# Patient Record
Sex: Female | Born: 1970 | Hispanic: Refuse to answer | Marital: Single
Health system: Midwestern US, Academic
[De-identification: ages and names within clinical notes are randomized; demographics above are authoritative.]

---

## 2019-09-25 ENCOUNTER — Encounter: Admit: 2019-09-25 | Discharge: 2019-09-25 | Payer: MEDICARE | Primary: Neurology

## 2019-09-25 NOTE — Telephone Encounter
Spoke with pt    Initial Visit Date: 09/27/19     Reason for Visit:epilepsy     Any recent injuries?    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 Then by who and at what age? 2003     Physician Information  PCP: will bring name to appt  Referring Physician: self referred  Previous Neurologist:  Dr. Allegra Grana  Other providers seen:      Previous Imaging/Procedures: at [facility], requesting records     Upcoming [facility] Appointments:     Recent ED/Hospitalizations:      Current Meds: pt cannot remember names and does not have with her, will bring bottles with her  Current Allergies: will bring list with her to visit  Past Tried and Failed AED: unsure  Reason for Discontinuing any AED's:

## 2019-09-27 ENCOUNTER — Encounter: Admit: 2019-09-27 | Discharge: 2019-09-27 | Payer: MEDICARE | Primary: Neurology

## 2019-09-27 ENCOUNTER — Ambulatory Visit: Admit: 2019-09-27 | Discharge: 2019-09-28 | Payer: MEDICARE | Primary: Neurology

## 2019-09-27 DIAGNOSIS — F4310 Post-traumatic stress disorder, unspecified: Secondary | ICD-10-CM

## 2019-09-27 DIAGNOSIS — R569 Unspecified convulsions: Secondary | ICD-10-CM

## 2019-09-27 DIAGNOSIS — I10 Essential (primary) hypertension: Secondary | ICD-10-CM

## 2019-09-27 DIAGNOSIS — G40209 Localization-related (focal) (partial) symptomatic epilepsy and epileptic syndromes with complex partial seizures, not intractable, without status epilepticus: Principal | ICD-10-CM

## 2019-09-27 MED ORDER — LAMOTRIGINE 300 MG PO TR24
300 mg | ORAL_TABLET | Freq: Every day | ORAL | 3 refills | Status: AC
Start: 2019-09-27 — End: ?

## 2019-09-27 MED ORDER — SUMATRIPTAN SUCCINATE 50 MG PO TAB
50 mg | ORAL_TABLET | ORAL | 1 refills | Status: AC | PRN
Start: 2019-09-27 — End: ?

## 2019-09-28 ENCOUNTER — Encounter: Admit: 2019-09-28 | Discharge: 2019-09-28 | Payer: MEDICARE | Primary: Neurology

## 2019-09-28 DIAGNOSIS — I10 Essential (primary) hypertension: Secondary | ICD-10-CM

## 2019-09-28 DIAGNOSIS — F4310 Post-traumatic stress disorder, unspecified: Secondary | ICD-10-CM

## 2019-09-28 DIAGNOSIS — R569 Unspecified convulsions: Secondary | ICD-10-CM

## 2020-01-28 ENCOUNTER — Ambulatory Visit: Admit: 2020-01-28 | Discharge: 2020-01-28 | Payer: MEDICARE | Primary: Neurology

## 2020-01-28 ENCOUNTER — Encounter: Admit: 2020-01-28 | Discharge: 2020-01-28 | Payer: MEDICARE | Primary: Neurology

## 2020-01-28 DIAGNOSIS — G40909 Epilepsy, unspecified, not intractable, without status epilepticus: Secondary | ICD-10-CM

## 2020-01-28 DIAGNOSIS — G43009 Migraine without aura, not intractable, without status migrainosus: Secondary | ICD-10-CM

## 2020-01-28 DIAGNOSIS — I10 Essential (primary) hypertension: Secondary | ICD-10-CM

## 2020-01-28 DIAGNOSIS — F4310 Post-traumatic stress disorder, unspecified: Secondary | ICD-10-CM

## 2020-01-28 DIAGNOSIS — R569 Unspecified convulsions: Secondary | ICD-10-CM

## 2020-01-28 MED ORDER — LAMOTRIGINE 200 MG PO TR24
200 mg | ORAL_TABLET | Freq: Every day | ORAL | 11 refills | Status: AC
Start: 2020-01-28 — End: ?

## 2020-01-28 MED ORDER — LAMOTRIGINE 50 MG PO TR24
ORAL_TABLET | Freq: Every day | 0 refills | Status: AC
Start: 2020-01-28 — End: ?

## 2020-01-28 MED ORDER — LACOSAMIDE 200 MG PO TAB
200 mg | ORAL_TABLET | Freq: Two times a day (BID) | ORAL | 1 refills | 52.50000 days | Status: AC
Start: 2020-01-28 — End: ?

## 2020-01-28 NOTE — Procedures
OUTPATIENT ROUTINE EEG REPORT    Catherine Taylor  [birth year]  [id number]    Date of service: 01/28/20    History: This is a 49 y.o. female presenting with epileptic and non-epileptic seizures.     Pertinent medications: lacosamide, lamotrigine    Introduction:  This study was performed using digital electroencephalographic recording equipment. International 10-20 electrode placement was used along with FT9 and FT10 electrodes. The record was obtained with the patient in the awake and drowsy states.  Photic stimulation is performed.  Hyperventilation is not performed.  The study is performed from 13:37 to 14:03 on 01/28/20.    Description:   The posterior dominant rhythm is 10 Hz.     Drowsiness results in attenuation of the background and increased theta activity. Stage II of sleep is not seen.    Clinical correlation:  This is a normal awake and drowsy routine EEG.  No epileptiform findings or lateralizing signs are seen.    Barb Merino, MD  Epilepsy Fellow

## 2020-01-28 NOTE — Progress Notes
Date of Service: 01/28/2020    Subjective:             Catherine Taylor is a 49 y.o. female.    History of Present Illness  She may have had a headache during sleep. She woke up with a bad migraine. This is usually how she knows she had a seizure. No tongue bite or urinary incontinence.   She did not notice any difference in the way she feels with the increase in Lamictal. Prior to increase in Lamictal she was having 1-2 seizure per month.     Migraine 2-3 times a month. Imitrex for acute treatment works well.          Review of Systems      Objective:         ? calcium carbonate/vitamin D3 (CALCIUM + D PO) Take  by mouth.   ? FOLIC ACID PO Take  by mouth.   ? hydroCHLOROthiazide (HYDRODIURIL) 50 mg tablet Take 25 mg by mouth every morning.   ? lacosamide (VIMPAT) 200 mg tab Take  by mouth twice daily.   ? lamoTRIgine XR (LAMICTAL XR) 300 mg tablet Take one tablet by mouth daily.   ? magnesium citrate oral solution Take 296 mL by mouth once.   ? prazosin (MINIPRESS) 5 mg capsule Take 5 mg by mouth at bedtime daily.   ? sertraline (ZOLOFT) 50 mg tablet Take 50 mg by mouth daily.   ? SUMAtriptan succinate (IMITREX) 50 mg tablet Take one tablet by mouth every 2 hours as needed for Migraine symptoms (NTE 2 doses in 24 hours). Dose may be repeated in 2 hours if needed. Max of 2 tablets in 24 hours.   ? tiZANidine (ZANAFLEX) 4 mg tablet Take 4 mg by mouth every 6 hours as needed.   ? traZODone (DESYREL) 100 mg tablet Take 100 mg by mouth at bedtime daily.     Vitals:    01/28/20 1418   BP: (!) 154/86   Pulse: 61   Weight: 74.4 kg (164 lb)   Height: 165.1 cm (65)   PainSc: Zero     Body mass index is 27.29 kg/m?Marland Kitchen     Physical Exam    Mental Status:Grossly normal  Speech is fluent without dysarthria.    CN:   III, IV, VI: EOEMI without nystagmus  VII: facial movements are symmetric      Motor:  No pronator drift.     Coordination:    RUE LUE RLE LLE   Finger to Nose normal normal       Gait  Casual: normal Assessment and Plan:          Unclassified epileptic seizures (HCC)  One breakthrough seizure since last visit.   Will increase lamotrigine XR to 350 mg daily for 2 weeks then 400 mg daily.   Continue vimpat 200 mg BID.     Migraine without aura and without status migrainosus, not intractable  Doing well on Imitrex 50 mg for acute treatment.

## 2020-01-28 NOTE — Assessment & Plan Note
Doing well on Imitrex 50 mg for acute treatment.

## 2020-01-28 NOTE — Patient Instructions
Increase lamictal xr as follows    Week 1-2 lamictal XR 350 mg a day then increase to 400 mg a day.

## 2020-01-28 NOTE — Progress Notes
Routine 25 minute outpatient EEG completed without complications. Recording began at 13:35:55.    Paperwork including the patient history and EEG information sheet was provided to the patient.     The test was explained to the patient in detail and all questions were answered regarding the performing of the EEG.     All electrodes were below 5,000kOhm and recording properly. Photic Stimulation was performed. Hyperventilation was not performed due to current ACNS guidelines.    After the test the scalp was inspected for any skin breakdown and was cleaned with warm water and  a washcloth. Education was given to the patient about proper hair care after the EEG.    The patient was educated to check in MyChart or contact the physician's office in approximately 2 weeks regarding the results of their EEG.

## 2020-01-28 NOTE — Assessment & Plan Note
One breakthrough seizure since last visit.   Will increase lamotrigine XR to 350 mg daily for 2 weeks then 400 mg daily.   Continue vimpat 200 mg BID.

## 2020-05-14 ENCOUNTER — Encounter: Admit: 2020-05-14 | Discharge: 2020-05-14 | Payer: MEDICARE | Primary: Neurology

## 2020-05-14 ENCOUNTER — Ambulatory Visit: Admit: 2020-05-14 | Discharge: 2020-05-15 | Payer: MEDICARE | Primary: Neurology

## 2020-05-14 DIAGNOSIS — R569 Unspecified convulsions: Secondary | ICD-10-CM

## 2020-05-14 DIAGNOSIS — F4310 Post-traumatic stress disorder, unspecified: Secondary | ICD-10-CM

## 2020-05-14 DIAGNOSIS — I10 Essential (primary) hypertension: Secondary | ICD-10-CM

## 2020-09-03 MED ORDER — SUMATRIPTAN SUCCINATE 50 MG PO TAB
50 mg | ORAL_TABLET | ORAL | 1 refills | Status: CN | PRN
Start: 2020-09-03 — End: ?

## 2020-09-04 ENCOUNTER — Encounter: Admit: 2020-09-04 | Discharge: 2020-09-04 | Payer: MEDICARE | Primary: Neurology

## 2020-09-04 ENCOUNTER — Ambulatory Visit: Admit: 2020-09-04 | Discharge: 2020-09-05 | Payer: MEDICARE | Primary: Neurology

## 2020-09-04 DIAGNOSIS — I10 Essential (primary) hypertension: Secondary | ICD-10-CM

## 2020-09-04 DIAGNOSIS — G40909 Epilepsy, unspecified, not intractable, without status epilepticus: Secondary | ICD-10-CM

## 2020-09-04 DIAGNOSIS — G43009 Migraine without aura, not intractable, without status migrainosus: Secondary | ICD-10-CM

## 2020-09-04 DIAGNOSIS — R569 Unspecified convulsions: Secondary | ICD-10-CM

## 2020-09-04 DIAGNOSIS — F4310 Post-traumatic stress disorder, unspecified: Secondary | ICD-10-CM

## 2020-09-04 MED ORDER — LAMOTRIGINE 300 MG PO TR24
300 mg | ORAL_TABLET | Freq: Every day | ORAL | 3 refills | Status: AC
Start: 2020-09-04 — End: ?

## 2020-09-04 MED ORDER — LACOSAMIDE 200 MG PO TAB
200 mg | ORAL_TABLET | Freq: Two times a day (BID) | ORAL | 1 refills | 52.50000 days | Status: AC
Start: 2020-09-04 — End: ?

## 2020-09-04 NOTE — Progress Notes
Date of Service: 09/04/2020    Subjective:             Catherine Taylor is a 50 y.o. female.    History of Present Illness  She was under a lot of stress last month. She had 2 seizures. She was stressed due to her mother's illness. One was epileptic and one was not. Last epileptic seizure was 18 mo ago.     One happened right before she was going to take her medications in the morning.   She felt the seizure coming. She showed me a video today. She had a blank stare. She is unaware. She has repetitive swallowing and lip smacking.     With her more recent seizure she did not lose consciousness. She felt her body shaking. This felt similar to her non-epileptic seizures in the past.     Migraines occur post ictal. Will take  Ibuprofen 800 mg which works well.          Review of Systems      Objective:         ? calcium carbonate/vitamin D3 (CALCIUM + D PO) Take  by mouth.   ? FOLIC ACID PO Take  by mouth.   ? hydroCHLOROthiazide (HYDRODIURIL) 50 mg tablet Take 25 mg by mouth every morning.   ? lacosamide (VIMPAT) 200 mg tablet Take one tablet by mouth twice daily.   ? lamoTRIgine XR (LAMICTAL XR) 300 mg tablet Take one tablet by mouth daily.   ? lamoTRIgine XR (LAMICTAL XR) 50 mg tablet 50 mg PO daily x14d (take with 300 mg tab)   ? magnesium citrate oral solution Take 296 mL by mouth once.   ? prazosin (MINIPRESS) 5 mg capsule Take 5 mg by mouth at bedtime daily.   ? sertraline (ZOLOFT) 50 mg tablet Take 50 mg by mouth daily.   ? SUMAtriptan succinate (IMITREX) 50 mg tablet Take one tablet by mouth every 2 hours as needed for Migraine symptoms (NTE 2 doses in 24 hours). Dose may be repeated in 2 hours if needed. Max of 2 tablets in 24 hours.   ? tiZANidine (ZANAFLEX) 4 mg tablet Take 4 mg by mouth every 6 hours as needed.   ? traZODone (DESYREL) 100 mg tablet Take 100 mg by mouth at bedtime daily.     Vitals:    09/04/20 1436   BP: (!) 174/98   Pulse: 88   Weight: 74.8 kg (165 lb)   Height: 165.1 cm (5' 5)   PainSc: Zero     Body mass index is 27.46 kg/m?Marland Kitchen     Physical Exam  Mental Status:Grossly normal  Speech is fluent without dysarthria.    CN:   III, IV, VI: EOEMI without nystagmus  VII: facial movements are symmetric      Motor:  No pronator drift.       Gait  Casual: normal             Assessment and Plan:          Unclassified epileptic seizures [facility])  Patient has both epileptic and non epileptic seizures. She has had at least one epileptic and one non epileptic seizure in the last month.   Will continue Vimpat 200 mg BID and Lamotrigine XR 300 mg daily.   follow up in 4 mo.

## 2020-09-04 NOTE — Assessment & Plan Note
Patient has both epileptic and non epileptic seizures. She has had at least one epileptic and one non epileptic seizure in the last month.   Will continue Vimpat 200 mg BID and Lamotrigine XR 300 mg daily.   follow up in 4 mo.

## 2020-09-05 DIAGNOSIS — G40209 Localization-related (focal) (partial) symptomatic epilepsy and epileptic syndromes with complex partial seizures, not intractable, without status epilepticus: Secondary | ICD-10-CM

## 2021-01-14 ENCOUNTER — Ambulatory Visit: Admit: 2021-01-14 | Discharge: 2021-01-15 | Payer: MEDICARE | Primary: Neurology

## 2021-01-14 ENCOUNTER — Encounter: Admit: 2021-01-14 | Discharge: 2021-01-14 | Payer: MEDICARE | Primary: Neurology

## 2021-01-14 DIAGNOSIS — R569 Unspecified convulsions: Secondary | ICD-10-CM

## 2021-01-14 DIAGNOSIS — G43909 Migraine, unspecified, not intractable, without status migrainosus: Secondary | ICD-10-CM

## 2021-01-14 DIAGNOSIS — M1990 Unspecified osteoarthritis, unspecified site: Secondary | ICD-10-CM

## 2021-01-14 DIAGNOSIS — I10 Essential (primary) hypertension: Secondary | ICD-10-CM

## 2021-01-14 DIAGNOSIS — R519 Generalized headaches: Secondary | ICD-10-CM

## 2021-01-14 DIAGNOSIS — G43009 Migraine without aura, not intractable, without status migrainosus: Secondary | ICD-10-CM

## 2021-01-14 DIAGNOSIS — F4310 Post-traumatic stress disorder, unspecified: Secondary | ICD-10-CM

## 2021-01-14 DIAGNOSIS — G479 Sleep disorder, unspecified: Secondary | ICD-10-CM

## 2021-01-14 MED ORDER — MAGNESIUM OXIDE 400 MG (241.3 MG MAGNESIUM) PO TAB
400 mg | ORAL_TABLET | Freq: Every day | ORAL | 6 refills | Status: AC
Start: 2021-01-14 — End: ?

## 2021-01-14 MED ORDER — LACOSAMIDE 200 MG PO TAB
200 mg | ORAL_TABLET | Freq: Two times a day (BID) | ORAL | 4 refills | 52.50000 days | Status: AC
Start: 2021-01-14 — End: ?

## 2021-01-14 MED ORDER — LAMOTRIGINE 300 MG PO TR24
300 mg | ORAL_TABLET | Freq: Every day | ORAL | 3 refills | Status: AC
Start: 2021-01-14 — End: ?
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
## 2021-01-15 DIAGNOSIS — G40209 Localization-related (focal) (partial) symptomatic epilepsy and epileptic syndromes with complex partial seizures, not intractable, without status epilepticus: Secondary | ICD-10-CM

## 2021-02-20 ENCOUNTER — Encounter: Admit: 2021-02-20 | Discharge: 2021-02-20 | Payer: MEDICARE | Primary: Neurology

## 2021-02-20 MED ORDER — LAMOTRIGINE 200 MG PO TR24
ORAL_TABLET | Freq: Every day | 11 refills
Start: 2021-02-20 — End: ?

## 2021-02-20 NOTE — Telephone Encounter
Called pt to verify if she is still using [facility] mail [facility] due to a refill request we received from [place] in [place]. Pt stated she does not use this [facility] in [place] and never has. Will refuse refill and delete that pharmacy.

## 2021-09-21 ENCOUNTER — Ambulatory Visit: Admit: 2021-09-21 | Discharge: 2021-09-22 | Payer: MEDICARE | Primary: Neurology

## 2021-09-21 ENCOUNTER — Encounter: Admit: 2021-09-21 | Discharge: 2021-09-21 | Payer: MEDICARE | Primary: Neurology

## 2021-09-21 DIAGNOSIS — M1990 Unspecified osteoarthritis, unspecified site: Secondary | ICD-10-CM

## 2021-09-21 DIAGNOSIS — G40909 Epilepsy, unspecified, not intractable, without status epilepticus: Secondary | ICD-10-CM

## 2021-09-21 DIAGNOSIS — I10 Essential (primary) hypertension: Secondary | ICD-10-CM

## 2021-09-21 DIAGNOSIS — R519 Generalized headaches: Secondary | ICD-10-CM

## 2021-09-21 DIAGNOSIS — G479 Sleep disorder, unspecified: Secondary | ICD-10-CM

## 2021-09-21 DIAGNOSIS — G43009 Migraine without aura, not intractable, without status migrainosus: Secondary | ICD-10-CM

## 2021-09-21 DIAGNOSIS — G43909 Migraine, unspecified, not intractable, without status migrainosus: Secondary | ICD-10-CM

## 2021-09-21 DIAGNOSIS — R569 Unspecified convulsions: Secondary | ICD-10-CM

## 2021-09-21 DIAGNOSIS — F4310 Post-traumatic stress disorder, unspecified: Secondary | ICD-10-CM

## 2021-09-21 MED ORDER — LORAZEPAM 1 MG PO TAB
ORAL_TABLET | ORAL | 0 refills | 12.00000 days | Status: AC
Start: 2021-09-21 — End: ?

## 2021-09-21 MED ORDER — LAMOTRIGINE 300 MG PO TR24
300 mg | ORAL_TABLET | Freq: Every day | ORAL | 3 refills | Status: AC
Start: 2021-09-21 — End: ?

## 2021-09-21 NOTE — Assessment & Plan Note
improved- continue magnesium for prevention and ibuprofen for acute treatment.

## 2021-09-21 NOTE — Progress Notes
CC:    HPI:  Saw Dr. Trish Mage for headaches. Was started on magnesium.     She feels like lacosamide makes her mouth numb. This just started happening recently. She notices about 5 minutes after she takes it her mouth will get numb. No swelling. This will last for 3-4 minutes and then goes back to normal. No slurred speech.     She had a recent seizures. Last month she had 3 seizures. She feels like this was due to stress. Her father had a recent stroke.   She was told she had a seizure during sleep. She woke up with a migraine.     Overall migraines have improved. She has about 3 migraines per month. She takes ibuprofen 800 mg with completely relieves her migraine.      Per prior notes:  Aura: will get a funny feeling like something is going to happen. Has both epileptic and non epileptic seizures. Will have loss of consciousness (these are the epileptic ones).  Ictal: eyes open, left hand shaking, lip jittery, makes noises.duration 3 minutes.    Post Ictal: confused, takes about 5 minutes to recover.   Current AEDs: vimpat 200 mg bid and lamictal 250 mg daily  Past AEDs: keppra  EEG: 01/28/2020: normal  05/14/2013: left fronto-temporal sharp waves [facility]ing)  04/17/2010: left fronto-temporal slowing.     MRI: at [facility] (2017)- showed white matter disease.   ?  nonepileptic- no loss of awareness. Feels paralyzed. Feel like she is outside of her body and looking down at herself. Last about 3 min. Has not had one of these for 4-5 months.   ?  Triggers- stress,            Medical History:   Diagnosis Date   ? Arthritis    ? Convulsion (HCC)    ? Generalized headaches    ? Hypertension    ? Migraines    ? PTSD (post-traumatic stress disorder)    ? Sleep disorder      Surgical History:   Procedure Laterality Date   ? THYROID SURGERY       Family History   Problem Relation Age of Onset   ? Stroke Mother    ? Unknown to Patient Father    ? Seizures Neg Hx      Social History     Socioeconomic History ? Marital status: Single   Tobacco Use   ? Smoking status: Never   ? Smokeless tobacco: Never   Substance and Sexual Activity   ? Alcohol use: Yes     Alcohol/week: 0.0 standard drinks   ? Drug use: Never   ? Sexual activity: Yes     Partners: Male     Birth control/protection: Surgical       No Known Allergies        Current Outpatient Medications:   ?  calcium carbonate/vitamin D3 (CALCIUM + D PO), Take  by mouth., Disp: , Rfl:   ?  FOLIC ACID PO, Take  by mouth., Disp: , Rfl:   ?  hydroCHLOROthiazide (HYDRODIURIL) 50 mg tablet, Take one-half tablet by mouth every morning., Disp: , Rfl:   ?  lacosamide (VIMPAT) 200 mg tablet, Take one tablet by mouth twice daily., Disp: 180 tablet, Rfl: 4  ?  lamoTRIgine XR (LAMICTAL XR) 300 mg tablet, Take one tablet by mouth daily., Disp: 90 tablet, Rfl: 3  ?  magnesium citrate oral solution, Take 296 mL by mouth once., Disp: , Rfl:   ?  magnesium oxide (MAGOX) 400 mg (241.3 mg magnesium) tablet, Take one tablet by mouth daily. Indications: For migraine headaches, Disp: 30 tablet, Rfl: 6  ?  prazosin (MINIPRESS) 5 mg capsule, Take one capsule by mouth at bedtime daily., Disp: , Rfl:   ?  sertraline (ZOLOFT) 50 mg tablet, Take one tablet by mouth daily., Disp: , Rfl:   ?  tiZANidine (ZANAFLEX) 4 mg tablet, Take one tablet by mouth every 6 hours as needed., Disp: , Rfl:   ?  traZODone (DESYREL) 100 mg tablet, Take one tablet by mouth at bedtime daily., Disp: , Rfl:       PE:  Vitals:    09/21/21 1059   BP: (!) 162/102   Pulse: 80   Temp: 36.6 ?C (97.8 ?F)   Resp: 18   SpO2: 98%         Mental Status:Grossly normal  Speech is fluent without dysarthria.    CN:   III, IV, VI: EOEMI without nystagmus  VII: facial movements are symmetric      Motor:  No pronator drift.     Coordination:    RUE LUE RLE LLE   Finger to Nose normal normal       Gait  Casual: normal             IMPRESSION/PLAN      Unclassified epileptic seizures (HCC)  Patient continues to have seizures   Will change to BRAND lacosamide due to mouth numbness while taking generic lacosamide.   Continue lamotrigine xr 300 mg   MRI brian wo- 3 t seizure protocol.   We discussed epilepsy presurgical evaluation. At this point patient does not want to be scheduled for video-EEG but will get MRI.     Migraine without aura and without status migrainosus, not intractable  improved- continue magnesium for prevention and ibuprofen for acute treatment.      Total Time Today was 35 minutes in the following activities: Preparing to see the patient, Performing a medically appropriate examination and/or evaluation, Ordering medications, tests, or procedures and Documenting clinical information in the electronic or other health record

## 2021-09-22 ENCOUNTER — Encounter: Admit: 2021-09-22 | Discharge: 2021-09-22 | Payer: MEDICARE | Primary: Neurology

## 2021-09-22 DIAGNOSIS — G40209 Localization-related (focal) (partial) symptomatic epilepsy and epileptic syndromes with complex partial seizures, not intractable, without status epilepticus: Secondary | ICD-10-CM

## 2021-09-22 MED ORDER — VIMPAT 200 MG PO TAB
200 mg | ORAL_TABLET | Freq: Two times a day (BID) | ORAL | 5 refills | 52.50000 days | Status: AC
Start: 2021-09-22 — End: ?

## 2021-09-23 ENCOUNTER — Encounter: Admit: 2021-09-23 | Discharge: 2021-09-23 | Payer: MEDICARE | Primary: Neurology

## 2021-12-25 ENCOUNTER — Encounter: Admit: 2021-12-25 | Discharge: 2021-12-25 | Payer: MEDICARE | Primary: Neurology

## 2021-12-25 ENCOUNTER — Ambulatory Visit: Admit: 2021-12-25 | Discharge: 2021-12-25 | Payer: MEDICARE | Primary: Neurology

## 2021-12-25 DIAGNOSIS — G40209 Localization-related (focal) (partial) symptomatic epilepsy and epileptic syndromes with complex partial seizures, not intractable, without status epilepticus: Secondary | ICD-10-CM

## 2022-01-11 ENCOUNTER — Encounter: Admit: 2022-01-11 | Discharge: 2022-01-11 | Payer: MEDICARE | Primary: Neurology

## 2022-01-11 DIAGNOSIS — G40209 Localization-related (focal) (partial) symptomatic epilepsy and epileptic syndromes with complex partial seizures, not intractable, without status epilepticus: Secondary | ICD-10-CM

## 2022-01-11 MED ORDER — LAMOTRIGINE 300 MG PO TR24
300 mg | ORAL_TABLET | Freq: Every day | ORAL | 3 refills | Status: AC
Start: 2022-01-11 — End: ?

## 2022-01-21 ENCOUNTER — Ambulatory Visit: Admit: 2022-01-21 | Discharge: 2022-01-22 | Payer: MEDICARE | Primary: Neurology

## 2022-01-21 ENCOUNTER — Encounter: Admit: 2022-01-21 | Discharge: 2022-01-21 | Payer: MEDICARE | Primary: Neurology

## 2022-01-21 DIAGNOSIS — G40909 Epilepsy, unspecified, not intractable, without status epilepticus: Secondary | ICD-10-CM

## 2022-01-21 DIAGNOSIS — G43009 Migraine without aura, not intractable, without status migrainosus: Secondary | ICD-10-CM

## 2022-01-21 DIAGNOSIS — F4310 Post-traumatic stress disorder, unspecified: Secondary | ICD-10-CM

## 2022-01-21 DIAGNOSIS — M1990 Unspecified osteoarthritis, unspecified site: Secondary | ICD-10-CM

## 2022-01-21 DIAGNOSIS — G479 Sleep disorder, unspecified: Secondary | ICD-10-CM

## 2022-01-21 DIAGNOSIS — G40209 Localization-related (focal) (partial) symptomatic epilepsy and epileptic syndromes with complex partial seizures, not intractable, without status epilepticus: Secondary | ICD-10-CM

## 2022-01-21 DIAGNOSIS — G43909 Migraine, unspecified, not intractable, without status migrainosus: Secondary | ICD-10-CM

## 2022-01-21 DIAGNOSIS — R519 Generalized headaches: Secondary | ICD-10-CM

## 2022-01-21 DIAGNOSIS — R569 Unspecified convulsions: Secondary | ICD-10-CM

## 2022-01-21 DIAGNOSIS — I10 Essential (primary) hypertension: Secondary | ICD-10-CM

## 2022-01-21 MED ORDER — VIMPAT 200 MG PO TAB
200 mg | ORAL_TABLET | Freq: Two times a day (BID) | ORAL | 1 refills | 52.50000 days | Status: AC
Start: 2022-01-21 — End: ?

## 2022-01-21 NOTE — Assessment & Plan Note
Patient predominantly has migraines after seizures.  As her seizures have been well controlled over the last several months her migraines have improved as well.  She usually takes acute treatment with ibuprofen as needed.

## 2022-01-21 NOTE — Progress Notes
CC:    HPI:  Changed back to BRAND Vimpat. Mouth is still numb. She has this sensation after she takes her medication. Will stay numb for 3-4 minutes.     April 2023 was her last seizure. She was asleep when this seizure started. Woke up someone in her house by making noises, she was having convulsions. Postictal was not able to breathe. She did bite her tongue, was very weak afterwards. She had missed 2 doses of medications. Saw her PCP a few days and her PCP noticed her face was droop, slurred speech. Took her about a week to feel back to normal.     Headaches have been much better. She usually only has headaches after seizures.   Per prior notes:  Aura:?will get a funny feeling like something is going to happen. Has both epileptic and non epileptic seizures. Will have?loss of consciousness?(these are the epileptic ones).  Ictal:?eyes open, left hand shaking, lip jittery, makes noises.duration 3 minutes.??  Post Ictal:?confused, takes about 5 minutes to recover.?  Current AEDs:?vimpat 200 mg bid and lamictal 250 mg daily  Past AEDs:?keppra  EEG:?01/28/2020: normal  05/14/2013: left fronto-temporal sharp waves [facility])  04/17/2010: left fronto-temporal slowing.   ?  MRI:?at [facility] (2017)- showed white matter disease.   MRI 12/25/21: Normal.   ?  nonepileptic- no loss of awareness. Feels paralyzed. Feel like she is outside of her body and looking down at herself. Last about 3 min. Has not had one of these for 4-5 months.   ?  Triggers- stress,     Compliant with meds: Yes   Driving: Yes           Medical History:   Diagnosis Date   ? Arthritis    ? Convulsion (HCC)    ? Generalized headaches    ? Hypertension    ? Migraines    ? PTSD (post-traumatic stress disorder)    ? Sleep disorder      Surgical History:   Procedure Laterality Date   ? THYROID SURGERY       Family History   Problem Relation Age of Onset   ? Stroke Mother    ? Unknown to Patient Father    ? Seizures Neg Hx      Social History Socioeconomic History   ? Marital status: Single   Tobacco Use   ? Smoking status: Never   ? Smokeless tobacco: Never   Substance and Sexual Activity   ? Alcohol use: Yes     Alcohol/week: 0.0 standard drinks of alcohol   ? Drug use: Never   ? Sexual activity: Yes     Partners: Male     Birth control/protection: Surgical       No Known Allergies        Current Outpatient Medications:   ?  amLODIPine (NORVASC) 5 mg tablet, Take one tablet by mouth daily., Disp: , Rfl:   ?  atorvastatin (LIPITOR) 20 mg tablet, Take one tablet by mouth daily., Disp: , Rfl:   ?  azelastine (ASTELIN) 137 mcg (0.1 %) nasal spray, Apply one spray to two sprays to each nostril as directed twice daily as needed., Disp: , Rfl:   ?  calcium carbonate/vitamin D3 (CALCIUM + D PO), Take  by mouth., Disp: , Rfl:   ?  diclofenac sodium (VOLTAREN) 1 % topical gel, Apply two g topically to affected area four times daily as needed., Disp: , Rfl:   ?  estradioL (ESTRACE) 0.01 % (  0.1 mg/g) vaginal cream, Insert or Apply one g to vaginal area every 7 days., Disp: , Rfl:   ?  lamoTRIgine XR (LAMICTAL XR) 300 mg tablet, Take one tablet by mouth daily., Disp: 90 tablet, Rfl: 3  ?  LORazepam (ATIVAN) 1 mg tablet, 1 PO one hour prior to MRI, may repeat once., Disp: 2 tablet, Rfl: 0  ?  magnesium citrate oral solution, Take 296 mL by mouth once., Disp: , Rfl:   ?  magnesium oxide (MAGOX) 400 mg (241.3 mg magnesium) tablet, Take one tablet by mouth daily. Indications: For migraine headaches, Disp: 30 tablet, Rfl: 6  ?  prazosin (MINIPRESS) 5 mg capsule, Take one capsule by mouth at bedtime daily., Disp: , Rfl:   ?  sertraline (ZOLOFT) 50 mg tablet, Take 1.5 tablets by mouth daily., Disp: , Rfl:   ?  traZODone (DESYREL) 100 mg tablet, Take one tablet by mouth at bedtime daily., Disp: , Rfl:   ?  venlafaxine XR (EFFEXOR XR) 37.5 mg capsule, Take one capsule by mouth daily., Disp: , Rfl:   ?  VIMPAT 200 mg tablet, Take one tablet by mouth twice daily., Disp: 60 tablet, Rfl: 5      PE:    Vitals:    01/21/22 1515   BP: (!) 155/100   Pulse: 72   Temp: 36.8 ?C (98.3 ?F)   SpO2: 100%   PainSc: Zero   Weight: 87.9 kg (193 lb 12.8 oz)   Height: 165.1 cm (5' 5)        Mental Status:Grossly normal  Speech is fluent without dysarthria.    CN:   III, IV, VI: EOEMI without nystagmus  VII: facial movements are symmetric      Motor:  No pronator drift.     Coordination:    RUE LUE RLE LLE   Finger to Nose normal normal       Gait  Casual: normal             IMPRESSION/PLAN      Unclassified epileptic seizures (HCC)  Overall her seizures are stable.  She did have 2 seizures at toward the end of April in the setting of missed medication.  She reports that after that seizure she had a facial droop and slurred speech for about a week.  This was noted by her primary care physician but has since resolved without treatment.  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    Plan she is doing well on brand name Vimpat 200 mg twice daily we will continue.    Migraine without aura and without status migrainosus, not intractable  Patient predominantly has migraines after seizures.  As her seizures have been well controlled over the last several months her migraines have improved as well.  She usually takes acute treatment with ibuprofen as needed.

## 2022-01-21 NOTE — Assessment & Plan Note
Overall her seizures are stable.  She did have 2 seizures at toward the end of April in the setting of missed medication.  She reports that after that seizure she had a facial droop and slurred speech for about a week.  This was noted by her primary care physician but has since resolved without treatment.  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    Plan she is doing well on brand name Vimpat 200 mg twice daily we will continue.

## 2022-01-22 ENCOUNTER — Encounter: Admit: 2022-01-22 | Discharge: 2022-01-22 | Payer: MEDICARE | Primary: Neurology

## 2022-01-22 NOTE — Telephone Encounter
Attempted to reach pt to ask her re: where to send the completed physician statement form for the disabled placard; mailbox was full so unable to leave a message.

## 2022-07-15 ENCOUNTER — Encounter: Admit: 2022-07-15 | Discharge: 2022-07-15 | Payer: MEDICARE | Primary: Neurology

## 2022-07-15 NOTE — Telephone Encounter
Called patient for updated insurance information to complete prior authorization.    PA for Lamotrigine XR submitted via CMM  Qty 90 for 90 days     Key: [id number]  PA Case ID: [id number]  Rx #: [id number]     PCN: [facility]  Member ID: [id number]  Jackquline Denmark

## 2023-03-23 ENCOUNTER — Encounter: Admit: 2023-03-23 | Discharge: 2023-03-23 | Payer: MEDICARE | Primary: Neurology

## 2023-05-02 ENCOUNTER — Ambulatory Visit: Admit: 2023-05-02 | Discharge: 2023-05-02 | Payer: MEDICARE | Primary: Neurology

## 2023-05-02 ENCOUNTER — Encounter: Admit: 2023-05-02 | Discharge: 2023-05-02 | Payer: MEDICARE | Primary: Neurology

## 2023-05-02 DIAGNOSIS — G40209 Localization-related (focal) (partial) symptomatic epilepsy and epileptic syndromes with complex partial seizures, not intractable, without status epilepticus: Secondary | ICD-10-CM

## 2023-05-02 DIAGNOSIS — G40909 Epilepsy, unspecified, not intractable, without status epilepticus: Secondary | ICD-10-CM

## 2023-05-02 DIAGNOSIS — G43009 Migraine without aura, not intractable, without status migrainosus: Secondary | ICD-10-CM

## 2023-05-02 MED ORDER — VIMPAT 200 MG PO TAB
200 mg | ORAL_TABLET | Freq: Two times a day (BID) | ORAL | 1 refills | 52.50000 days | Status: AC
Start: 2023-05-02 — End: ?

## 2023-05-02 MED ORDER — LAMOTRIGINE 300 MG PO TR24
300 mg | ORAL_TABLET | Freq: Every day | ORAL | 3 refills | Status: AC
Start: 2023-05-02 — End: ?
  Filled 2023-05-03: qty 90, 90d supply, fill #1

## 2023-05-02 MED ORDER — IBUPROFEN 800 MG PO TAB
800 mg | ORAL_TABLET | ORAL | 1 refills | Status: AC | PRN
Start: 2023-05-02 — End: ?
  Filled 2023-05-03: qty 30, 10d supply, fill #1

## 2023-05-02 NOTE — Progress Notes
CC: follow up seizures and headaches.     HPI:  October 2024: She was asleep. She had a seizure. She was having trouble breathing afterwards. Her body felt funny and weak. She saw he PCP the next day- her face was drooping. She was having trouble talking. This lasted about 24 hours until she was back to normal. She was very stressed out at this time.   She has lost her mom and her sister in the last year.     Has noticed when she coughs she will have pain in the left side of the head- over the ear and into the left temple. This will last days. Mild nausea, +photophobia. Will take ibuprofen which helped. This happens about every other month.       Per prior notes:  Aura: will get a funny feeling like something is going to happen. Has both epileptic and non epileptic seizures. Will have loss of consciousness (these are the epileptic ones).  Ictal: eyes open, left hand shaking, lip jittery, makes noises.duration 3 minutes.    Post Ictal: confused, takes about 5 minutes to recover.   Current AEDs: vimpat 200 mg bid and lamictal 300 mg daily  Past AEDs: keppra  EEG: 01/28/2020: normal  05/14/2013: left fronto-temporal sharp waves [facility])  04/17/2010: left fronto-temporal slowing.      MRI: at [facility] (2017)- showed white matter disease.   MRI 12/25/21: Normal.      nonepileptic- no loss of awareness. Feels paralyzed. Feel like she is outside of her body and looking down at herself. Last about 3 min.     Triggers- stress,       Compliant with meds: Yes   Driving: Yes           Past Medical History:   Diagnosis Date    Arthritis     Convulsion (HCC)     Generalized headaches     Hypertension     Migraines     PTSD (post-traumatic stress disorder)     Sleep disorder      Surgical History:   Procedure Laterality Date    THYROID SURGERY       Family History   Problem Relation Name Age of Onset    Stroke Mother      Unknown to Patient Father      Seizures Neg Hx       Social History     Socioeconomic History Marital status: Single   Tobacco Use    Smoking status: Never    Smokeless tobacco: Never   Substance and Sexual Activity    Alcohol use: Yes     Alcohol/week: 0.0 standard drinks of alcohol    Drug use: Never    Sexual activity: Yes     Partners: Male     Birth control/protection: Surgical       No Known Allergies        Current Outpatient Medications:     amLODIPine (NORVASC) 5 mg tablet, Take one tablet by mouth daily., Disp: , Rfl:     atorvastatin (LIPITOR) 20 mg tablet, Take one tablet by mouth daily., Disp: , Rfl:     azelastine (ASTELIN) 137 mcg (0.1 %) nasal spray, Apply one spray to two sprays to each nostril as directed twice daily as needed., Disp: , Rfl:     calcium carbonate/vitamin D3 (CALCIUM + D PO), Take  by mouth., Disp: , Rfl:     diclofenac sodium (VOLTAREN) 1 % topical gel, Apply two  g topically to affected area four times daily as needed., Disp: , Rfl:     estradioL (ESTRACE) 0.01 % (0.1 mg/g) vaginal cream, Insert or Apply one g to vaginal area every 7 days., Disp: , Rfl:     lamoTRIgine XR (LAMICTAL XR) 300 mg tablet, Take one tablet by mouth daily., Disp: 90 tablet, Rfl: 3    LORazepam (ATIVAN) 1 mg tablet, 1 PO one hour prior to MRI, may repeat once., Disp: 2 tablet, Rfl: 0    magnesium citrate oral solution, Take 296 mL by mouth once., Disp: , Rfl:     magnesium oxide (MAGOX) 400 mg (241.3 mg magnesium) tablet, Take one tablet by mouth daily. Indications: For migraine headaches, Disp: 30 tablet, Rfl: 6    prazosin (MINIPRESS) 5 mg capsule, Take one capsule by mouth at bedtime daily., Disp: , Rfl:     sertraline (ZOLOFT) 50 mg tablet, Take 1.5 tablets by mouth daily., Disp: , Rfl:     traZODone (DESYREL) 100 mg tablet, Take one tablet by mouth at bedtime daily., Disp: , Rfl:     venlafaxine XR (EFFEXOR XR) 37.5 mg capsule, Take one capsule by mouth daily., Disp: , Rfl:     VIMPAT 200 mg tablet, Take one tablet by mouth twice daily., Disp: 180 tablet, Rfl: 1      PE:    Vitals: 05/02/23 1030   BP: (!) 149/84   Pulse: 76   Weight: 78 kg (172 lb)   Height: 167.6 cm (5' 6)        Mental Status:Grossly normal  Speech is fluent without dysarthria.    CN:   III, IV, VI: EOEMI without nystagmus  VII: facial movements- mild right facial droop.       Motor:  No pronator drift.     Coordination:    RUE LUE RLE LLE   Finger to Nose normal normal       Gait  Casual: normal                IMPRESSION/PLAN      Unclassified epileptic seizures [facility])  Last seizure October of 2023. She has had a right facial droop since then. Will order MRI head to evaluate for new lesion. She has had facial droop in the past with seizures but has never lasted this long.   Continue lacosamide 200 mg twice a day, lamotrigine XR 300 mg daily.   follow up in 6 mo.     Migraine without aura and without status migrainosus, not intractable  Migraines are rare- every other month or so.   Acute treatment with ibuprofen 800 mg daily.          Gearldine Shown, MD

## 2023-05-02 NOTE — Assessment & Plan Note
Migraines are rare- every other month or so.   Acute treatment with ibuprofen 800 mg daily.

## 2023-05-03 ENCOUNTER — Encounter: Admit: 2023-05-03 | Discharge: 2023-05-03 | Payer: MEDICARE | Primary: Neurology

## 2023-05-03 DIAGNOSIS — R29810 Facial weakness: Secondary | ICD-10-CM

## 2023-05-04 ENCOUNTER — Encounter: Admit: 2023-05-04 | Discharge: 2023-05-04 | Payer: MEDICARE | Primary: Neurology

## 2023-05-04 DIAGNOSIS — G40209 Localization-related (focal) (partial) symptomatic epilepsy and epileptic syndromes with complex partial seizures, not intractable, without status epilepticus: Secondary | ICD-10-CM

## 2023-05-04 MED ORDER — LACOSAMIDE 200 MG PO TAB
200 mg | ORAL_TABLET | Freq: Two times a day (BID) | ORAL | 1 refills | 52.50000 days | Status: AC
Start: 2023-05-04 — End: ?
  Filled 2023-05-07: qty 180, 90d supply, fill #1

## 2023-05-06 ENCOUNTER — Encounter: Admit: 2023-05-06 | Discharge: 2023-05-06 | Payer: MEDICARE | Primary: Neurology

## 2023-05-07 ENCOUNTER — Encounter: Admit: 2023-05-07 | Discharge: 2023-05-07 | Payer: MEDICARE | Primary: Neurology

## 2023-12-20 ENCOUNTER — Encounter: Admit: 2023-12-20 | Discharge: 2023-12-20 | Payer: MEDICARE | Primary: Neurology

## 2023-12-22 ENCOUNTER — Encounter: Admit: 2023-12-22 | Discharge: 2023-12-22 | Payer: MEDICARE | Primary: Neurology

## 2024-03-07 ENCOUNTER — Encounter: Admit: 2024-03-07 | Discharge: 2024-03-07 | Payer: MEDICARE | Primary: Neurology

## 2024-03-13 ENCOUNTER — Encounter: Admit: 2024-03-13 | Discharge: 2024-03-13 | Payer: MEDICARE | Primary: Neurology

## 2024-03-26 ENCOUNTER — Encounter: Admit: 2024-03-26 | Discharge: 2024-03-26 | Payer: MEDICARE | Primary: Neurology

## 2024-04-02 ENCOUNTER — Ambulatory Visit: Admit: 2024-04-02 | Discharge: 2024-04-03 | Payer: MEDICARE | Primary: Neurology

## 2024-04-02 ENCOUNTER — Encounter: Admit: 2024-04-02 | Discharge: 2024-04-02 | Payer: MEDICARE | Primary: Neurology

## 2024-04-02 VITALS — BP 143/78 | HR 70 | Ht 66.0 in | Wt 178.0 lb

## 2024-04-02 DIAGNOSIS — Z7689 Persons encountering health services in other specified circumstances: Secondary | ICD-10-CM

## 2024-04-02 DIAGNOSIS — G40209 Localization-related (focal) (partial) symptomatic epilepsy and epileptic syndromes with complex partial seizures, not intractable, without status epilepticus: Principal | ICD-10-CM

## 2024-04-02 DIAGNOSIS — R29810 Facial weakness: Secondary | ICD-10-CM

## 2024-04-02 NOTE — Progress Notes
 Date of Service: 04/02/2024    Subjective:             Catherine Taylor is a 53 y.o. female.    History of Present Illness    History of Present Illness  Catherine Taylor is a 53 year old female with epilepsy who presents with recent seizure activity.    Seizure activity  - Increased seizure frequency in September 2025, with two seizures earlier in the month, one seizure approximately two weeks prior to the visit,   - Seizures characterized by episodes of unconsciousness and shaking; [place], she was unaware of the event until informed by her niece  - Attributes increased seizure frequency to stress related to a court case and lack of sleep  - Adheres to antiepileptic medication regimen  - Does not drive due to seizure disorder      Continues to have right side facial droop.  - MRI ordered last visit, was not completed.     Headache and migraine symptoms  - Migraines occur in conjunction with seizures, approximately twice per month  - Headaches managed effectively with over-the-counter medications such as Tylenol or ibuprofen             Per prior notes:  Aura: will get a funny feeling like something is going to happen. Has both epileptic and non epileptic seizures. Will have loss of consciousness (these are the epileptic ones).  Ictal: eyes open, left hand shaking, lip jittery, makes noises.duration 3 minutes.    Post Ictal: confused, takes about 5 minutes to recover.   Current AEDs: vimpat  200 mg bid and lamictal  XR 300 mg daily  Past AEDs: keppra    EEG: 01/28/2020: normal  EEG: 05/14/2013: left fronto-temporal sharp waves [facility])  EEG: 04/17/2010: left fronto-temporal slowing.      MRI: at [facility] (2017)- showed white matter disease.   MRI 12/25/21: Normal.      nonepileptic- no loss of awareness. Feels paralyzed. Feel like she is outside of her body and looking down at herself. Last about 3 min.     Triggers- stress,          Compliant with meds: Yes   Driving: No       Past Medical History:    Arthritis    Convulsion (CMS-HCC)    Generalized headaches    Hypertension    Migraines    PTSD (post-traumatic stress disorder)    Sleep disorder     Surgical History:   Procedure Laterality Date    THYROID SURGERY       Family History   Problem Relation Name Age of Onset    Stroke Mother      Unknown to Patient Father      Seizures Neg Hx       Social History     Socioeconomic History    Marital status: Single   Tobacco Use    Smoking status: Never    Smokeless tobacco: Never   Substance and Sexual Activity    Alcohol use: Yes     Alcohol/week: 0.0 standard drinks of alcohol    Drug use: Never    Sexual activity: Yes     Partners: Male     Birth control/protection: Surgical         04/01/2024   Epilepsy Patient Diary   Did you have a lot of energy? 3   Have you felt down and low? 2   Driving limitations bother you.  5   Work limitations bother you? 5   Social limitations bother you? 1   Memory difficulties bother you? 2   Physical effects of antiepileptic drugs bother you? 5   Psychological effects of antiepileptic drugs bother you? 5   How afraid are you of having a seizure during the next 4 weeks? 4   How do you feel things have been going for you? 4   When was your last seizure? Within last week   How often do you have seizures? Unsure   In the last 12 months, have you had a seizure lasting more than 5 minutes or back-to-back seizures? No   In the last 12 months, have you had any emergency room visits/hospital admissions related to seizure activity? No   How often in the last 12 months have seizures changed or impacted your usual routines? Some days (less than half)   How often in the last 12 months have seizure medication side effects changed or impacted your usual routines? (Common side effects include dizziness, tiredness, nausea, trouble walking and double vision.) Some days (less than half)   How often in the last 12 months have you missed taking your seizure medications? Once or twice If you've ever missed your medications, why? Select all that apply. I have not missed medications.   Are you planning a pregnancy? Never/Not applicable   Total Score 54        Patient-reported          Objective:         amLODIPine (NORVASC) 5 mg tablet Take one tablet by mouth daily.    atorvastatin (LIPITOR) 20 mg tablet Take one tablet by mouth daily.    calcium carbonate/vitamin D3 (CALCIUM + D PO) Take  by mouth.    ibuprofen  (MOTRIN ) 800 mg tablet Take one tablet by mouth every 8 hours as needed for Pain. Take with food.    lacosamide  (VIMPAT ) 200 mg tablet Take one tablet by mouth twice daily.    lamoTRIgine  XR (LAMICTAL  XR) 300 mg tablet Take one tablet by mouth daily.    LORazepam  (ATIVAN ) 1 mg tablet 1 PO one hour prior to MRI, may repeat once.    prazosin (MINIPRESS) 5 mg capsule Take one capsule by mouth at bedtime daily.    sertraline (ZOLOFT) 50 mg tablet Take 1.5 tablets by mouth daily.    traZODone (DESYREL) 100 mg tablet Take one tablet by mouth at bedtime daily.     Vitals:    04/02/24 0949   BP: (!) 143/78   Pulse: 70   SpO2: 100%   Weight: 80.7 kg (178 lb)   Height: 167.6 cm (5' 6)     Body mass index is 28.73 kg/m?SABRA     Physical Exam    Mental Status:Grossly normal  Speech is fluent without dysarthria.    CN:   III, IV, VI: EOEMI without nystagmus  VII: facial movement- mild right lower facial droop      Motor:  No pronator drift.     Coordination:    RUE LUE RLE LLE   Finger to Nose normal normal       Gait  Casual: normal         Assessment and Plan:               Assessment & Plan  Focal unaware seizures.   Increased seizure frequency due to stress and sleep deprivation. Migraines post-seizure managed with Tylenol. Medication regimen effective.  -  Continue current seizure medication regimen. Lacosamide  200 mg twice a day and lamotrigine  XR 300 mg daily.   - Use Tylenol or ibuprofen  for migraine relief.    Right facial droop following a seizure in 2024  - MRI brain wo contrast.              Catherine FORBES Goodell, MD  Clinical Associate Professor of Neurology

## 2024-04-23 ENCOUNTER — Encounter: Admit: 2024-04-23 | Discharge: 2024-04-23 | Payer: MEDICARE | Primary: Neurology

## 2024-06-22 ENCOUNTER — Encounter: Admit: 2024-06-22 | Discharge: 2024-06-22 | Payer: MEDICARE | Primary: Neurology

## 2024-06-22 ENCOUNTER — Ambulatory Visit: Admit: 2024-06-22 | Discharge: 2024-06-22 | Payer: MEDICARE | Primary: Neurology

## 2024-06-22 NOTE — Telephone Encounter [36]
 Pt called and lvm wondering if Dr windle could provide her with a  medical marijuana card. Pt lives in [place]    Returned Catherine Taylor's call

## 2024-07-03 ENCOUNTER — Encounter: Admit: 2024-07-03 | Discharge: 2024-07-03 | Payer: MEDICARE | Primary: Neurology

## 2024-07-03 NOTE — Telephone Encounter [36]
 MRI is normal. No further rec at this time.

## 2024-07-03 NOTE — Telephone Encounter [36]
 Pt called and lvm wanting to know results of recent MRI     MRI results: 1. Normal appearance of brain.   2. No change from December 25, 2021       Catherine Taylor see if Dr Windle as further thoughts or recs

## 2024-07-03 NOTE — Telephone Encounter [36]
 Called pt to let her know that MRI is normal, no change in plan of care      LVM with above
